# Patient Record
Sex: Female | Born: 1990 | Race: Black or African American | Hispanic: No | Marital: Single | Smoking: Never smoker
Health system: Southern US, Community
[De-identification: ages and names within clinical notes are randomized; demographics above are authoritative.]

## PROBLEM LIST (undated history)

## (undated) DIAGNOSIS — F419 Anxiety disorder, unspecified: Secondary | ICD-10-CM

## (undated) DIAGNOSIS — I10 Essential (primary) hypertension: Secondary | ICD-10-CM

## (undated) DIAGNOSIS — G43109 Migraine with aura, not intractable, without status migrainosus: Secondary | ICD-10-CM

## (undated) DIAGNOSIS — D219 Benign neoplasm of connective and other soft tissue, unspecified: Secondary | ICD-10-CM

## (undated) DIAGNOSIS — N946 Dysmenorrhea, unspecified: Secondary | ICD-10-CM

## (undated) DIAGNOSIS — D649 Anemia, unspecified: Secondary | ICD-10-CM

## (undated) HISTORY — DX: Anxiety disorder, unspecified: F41.9

## (undated) HISTORY — DX: Migraine with aura, not intractable, without status migrainosus: G43.109

## (undated) HISTORY — DX: Essential (primary) hypertension: I10

## (undated) HISTORY — DX: Dysmenorrhea, unspecified: N94.6

## (undated) HISTORY — DX: Benign neoplasm of connective and other soft tissue, unspecified: D21.9

---
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
## 2010-06-30 LAB — URINE MICROSCOPIC-ADD ON

## 2010-06-30 LAB — URINALYSIS, ROUTINE W REFLEX MICROSCOPIC
Bilirubin Urine: NEGATIVE
Glucose, UA: NEGATIVE mg/dL
Ketones, ur: 15 mg/dL — AB
Nitrite: NEGATIVE
Protein, ur: 30 mg/dL — AB
Specific Gravity, Urine: 1.024 (ref 1.005–1.030)
Urobilinogen, UA: 1 mg/dL (ref 0.0–1.0)
pH: 6.5 (ref 5.0–8.0)

## 2010-06-30 LAB — POCT PREGNANCY, URINE: Preg Test, Ur: NEGATIVE

## 2012-05-01 ENCOUNTER — Emergency Department (HOSPITAL_COMMUNITY)
Admission: EM | Admit: 2012-05-01 | Discharge: 2012-05-01 | Disposition: A | Discharge status: Home / Self Care | Payer: Self-pay | Attending: Emergency Medicine | Admitting: Emergency Medicine

## 2012-05-01 ENCOUNTER — Encounter (HOSPITAL_COMMUNITY): Payer: Self-pay | Admitting: *Deleted

## 2012-05-01 DIAGNOSIS — J029 Acute pharyngitis, unspecified: Secondary | ICD-10-CM | POA: Insufficient documentation

## 2012-05-01 DIAGNOSIS — R42 Dizziness and giddiness: Secondary | ICD-10-CM | POA: Insufficient documentation

## 2012-05-01 DIAGNOSIS — R51 Headache: Secondary | ICD-10-CM | POA: Insufficient documentation

## 2012-05-01 DIAGNOSIS — R110 Nausea: Secondary | ICD-10-CM | POA: Insufficient documentation

## 2012-05-01 DIAGNOSIS — IMO0001 Reserved for inherently not codable concepts without codable children: Secondary | ICD-10-CM | POA: Insufficient documentation

## 2012-05-01 DIAGNOSIS — J111 Influenza due to unidentified influenza virus with other respiratory manifestations: Secondary | ICD-10-CM | POA: Insufficient documentation

## 2012-05-01 DIAGNOSIS — R5381 Other malaise: Secondary | ICD-10-CM | POA: Insufficient documentation

## 2012-05-01 MED ORDER — OXYCODONE-ACETAMINOPHEN 5-325 MG PO TABS: ORAL_TABLET | ORAL | Status: DC

## 2012-05-01 MED ORDER — OXYCODONE-ACETAMINOPHEN 5-325 MG PO TABS
2.0000 | ORAL_TABLET | Freq: Once | ORAL | Status: AC
  Administered 2012-05-01: 2 via ORAL
  Filled 2012-05-01: qty 2

## 2012-05-01 NOTE — ED Provider Notes (Signed)
History     CSN: [id number]  Arrival date & time 05/01/12  1859   First MD Initiated Contact with Patient 05/01/12 1921      Chief Complaint  Patient presents with  . Influenza    (Consider location/radiation/quality/duration/timing/severity/associated sxs/prior treatment) HPI  Ashley Bullock is a 22 y.o. female complaining of fever/chills, pharyngitis, myalgia in bilateral shoulders, frontal sinus pressure and lightheaded sensation when going from sitting to standing. Patient denies emesis, rhinorrhea, cough, shortness of breath, abdominal pain, nausea vomiting, change in bowel or bladder habits, rash or meningismus.  History reviewed. No pertinent past medical history.  History reviewed. No pertinent past surgical history.  History reviewed. No pertinent family history.  History  Substance Use Topics  . Smoking status: Never Smoker   . Smokeless tobacco: Not on file  . Alcohol Use: Yes    OB History    Grav Para Term Preterm Abortions TAB SAB Ect Mult Living                  Review of Systems  Constitutional: Positive for fever and chills.  HENT: Positive for sore throat.   Respiratory: Negative for shortness of breath.   Cardiovascular: Negative for chest pain.  Gastrointestinal: Negative for nausea, vomiting, abdominal pain and diarrhea.  Musculoskeletal: Positive for myalgias.  Neurological: Positive for light-headedness and headaches.  All other systems reviewed and are negative.    Allergies  Amoxicillin  Home Medications  No current outpatient prescriptions on file.  BP 136/72  Pulse 109  Temp 100 F (37.8 C) (Oral)  Resp 20  SpO2 96%  Physical Exam  Nursing note and vitals reviewed. Constitutional: She is oriented to person, place, and time. She appears well-developed and well-nourished. No distress.  HENT:  Head: Normocephalic.  Right Ear: External ear normal.  Left Ear: External ear normal.  Mouth/Throat: Oropharynx is clear and moist.  No oropharyngeal exudate.       No tenderness to palpation of maxillary or frontal sinuses bilaterally,   No tonsillar hypertrophy  Eyes: Conjunctivae normal and EOM are normal. Pupils are equal, round, and reactive to light.  Neck: Normal range of motion. Neck supple.  Cardiovascular: Normal rate, regular rhythm, normal heart sounds and intact distal pulses.   Pulmonary/Chest: Effort normal and breath sounds normal. No stridor. No respiratory distress. She has no wheezes. She has no rales.  Abdominal: Soft. Bowel sounds are normal. She exhibits no distension. There is no tenderness. There is no rebound and no guarding.  Musculoskeletal: Normal range of motion.  Neurological: She is alert and oriented to person, place, and time.  Psychiatric: She has a normal mood and affect.    ED Course  Procedures (including critical care time)   Labs Reviewed  RAPID STREP SCREEN   No results found.   1. Influenza-like illness       MDM  Patient with flulike symptoms. Vital signs stable mildly afebrile mildly tachycardic. Tolerating by mouth. Stable for discharge to home. Discussed options for Tamiflu. Patient deferred tamiflu treatment at this time.   Pt verbalized understanding and agrees with care plan. Outpatient follow-up and return precautions given.    New Prescriptions   OXYCODONE-ACETAMINOPHEN (PERCOCET/ROXICET) 5-325 MG PER TABLET    1 to 2 tabs PO q6hrs  PRN for pain          Wynetta Emery, PA-C 05/01/12 1954

## 2012-05-01 NOTE — ED Notes (Signed)
C/o "flu like sx", lists: sore throat, feeling weak, feel hot & cold, HA, facial pain, dizziness (with position changes)  & nausea, (denies: congestion, vd, bleeding), onset of sx Saturday night. Taking mucinex. Also, took multi sx pill.

## 2012-05-02 NOTE — ED Provider Notes (Signed)
Medical screening examination/treatment/procedure(s) were performed by non-physician practitioner and as supervising physician I was immediately available for consultation/collaboration.   Cace Osorto L Malva Diesing, MD 05/02/12 1238 

## 2012-07-10 ENCOUNTER — Emergency Department (HOSPITAL_COMMUNITY)
Admission: EM | Admit: 2012-07-10 | Discharge: 2012-07-10 | Disposition: A | Discharge status: Home / Self Care | Payer: PRIVATE HEALTH INSURANCE | Attending: Emergency Medicine | Admitting: Emergency Medicine

## 2012-07-10 ENCOUNTER — Encounter (HOSPITAL_COMMUNITY): Payer: Self-pay | Admitting: Emergency Medicine

## 2012-07-10 DIAGNOSIS — L309 Dermatitis, unspecified: Secondary | ICD-10-CM

## 2012-07-10 DIAGNOSIS — L299 Pruritus, unspecified: Secondary | ICD-10-CM | POA: Insufficient documentation

## 2012-07-10 DIAGNOSIS — H579 Unspecified disorder of eye and adnexa: Secondary | ICD-10-CM | POA: Insufficient documentation

## 2012-07-10 DIAGNOSIS — L259 Unspecified contact dermatitis, unspecified cause: Secondary | ICD-10-CM | POA: Insufficient documentation

## 2012-07-10 MED ORDER — DESONIDE 0.05 % EX OINT: TOPICAL_OINTMENT | Freq: Two times a day (BID) | CUTANEOUS | Status: DC

## 2012-07-10 MED ORDER — PREDNISONE 10 MG PO TABS: 20.0000 mg | ORAL_TABLET | Freq: Every day | ORAL | Status: DC

## 2012-07-10 MED ORDER — PREDNISONE 20 MG PO TABS
60.0000 mg | ORAL_TABLET | Freq: Once | ORAL | Status: AC
  Administered 2012-07-10: 60 mg via ORAL
  Filled 2012-07-10: qty 3

## 2012-07-10 NOTE — ED Notes (Signed)
RESOURCE GUIDE  Chronic Pain Problems: Contact [facility]  [phone] Patients need to be referred by their primary care doctor.  Insufficient Money for Medicine: Contact [employer] Way:  call "211."   No Primary Care Doctor: - Call [facility]  [phone] - can help you locate a primary care doctor that  accepts your insurance, provides certain services, etc. - Physician Referral Service- 1-800-533-3463  Agencies that provide inexpensive medical care: - [facility]  [phone] - [facility]  [phone] - [facility]  [phone] - [facility]  [phone] - Planned Parenthood  [phone] - [facility]  [phone]  Medicaid-accepting [place] Providers: - [facility]- [address]  [phone], Mon-Fri 9am-7pm, Sat 9am-1pm - [facility]- [address], [address]  [phone] - [facility]- [address]  [phone] - [facility]- [address] [place] [address]  [phone] - Veita Bland- [address], [phone]  Only accepts [place] Access Medicaid patients after they have their name  applied to their card  Self Pay (no insurance) in [place]: - Sickle Cell Patients: Dr Eric Dean, [facility]  [address], [phone] - [facility]- [address]  [phone]       -     [facility] [place]- [address]       -     [facility]- see information above (Speak to Pam H if you do not have insurance)       -  [facility] [place]- [address],  [phone]       -  [facility]- [address] [place] [address], [phone]       -  Dr Osei-Bonsu-  [address], [place], [phone]       -  [facility] - [address], [phone]       -  [facility]- [address] [place] [address], [phone],  also [address], [phone]       -    [facility]- [address], [phone], 1st & 3rd Saturday        every month, 10am-1pm  [facility] [address] [place], [place] [address] [phone]  [facility] [address] [place], [place] [address] [phone]  1) Find a Doctor and Pay Out of Pocket Although you won't have to find out who is covered by your insurance plan, it is a good idea to ask around and get recommendations. You will then need to call the office and see if the doctor you have chosen will accept you as a new patient and what types of options they offer for patients who are self-pay. Some doctors offer discounts or will set up payment plans for their patients who do not have insurance, but you will need to ask so you aren't surprised when you get to your appointment.  2) Contact Your Local Health Department Not all health departments have doctors that can see patients for sick visits, but many do, so it is worth a call to see if yours does. If you don't know where your local health department is, you can check in your phone book. The CDC also has a tool   to help you locate your state's health department, and many state websites also have listings of all of their local health departments.  3) Find a Walk-in Clinic If your illness is not likely to be very severe or complicated, you may want to try a walk in clinic. These are popping up all over the country in pharmacies, drugstores, and shopping centers. They're usually staffed by nurse practitioners or physician assistants that have been trained to treat common illnesses and complaints. They're usually fairly quick and inexpensive. However, if you have serious medical issues or chronic medical problems, these are probably not your best option  STD Testing - [place] [facility] [place], [facility] Clinic, [address], [place],  phone [phone] or 1-877-539-9860.  Monday - Friday, call for an appointment. - [place] [facility] [place], [facility], [address], [place], phone [phone] or 1-877-539-9860.  Monday - Friday, call for an appointment.  Abuse/Neglect: - [place] Child Abuse Hotline (336) 641-3795 - [place] Child Abuse Hotline 800-378-5315 (After Hours)  Emergency Shelter:  [facility] (336) 271-5985  Maternity Homes: - [facility] (336) 275-9566 - [facility] (704) 372-4663  MRSA Hotline #:   832-7006  Dental Assistance If unable to pay or uninsured, contact:  [place] [facility] to become qualified for the adult dental clinic.  Patients with Medicaid: [facility] [address], [phone], [phone]  If unable to pay, or uninsured, contact [place] [facility] ([phone] in [place], [phone] in [place]) to become qualified for the adult dental clinic  [facility] [address] [place], [place] [address] (336) [phone] [email or web address]  Other Low-Cost Community Dental Services: - [facility]- [address], [place], [place], [address], [phone], Ext. [address], 2nd and 4th Thursday of the month at 6:30am.  10 clients each day by appointment, can sometimes see walk-in patients if someone does not show for an appointment. - [facility]- [address], [place], [place], [address], [phone] - [facility]- [address], [place], [place], [address], [phone] - [place] [facility]- [phone] - [facility]- [phone] - [facility]- [phone]       Behavioral Health Resources in the Community  Intensive Outpatient Programs: [place] [facility]      [address] [place], [place] [phone] Both a day and  evening program       [facility]     [address]        [place], [place] [address] [phone]         ADS: [facility] [address] [place] 336-882-2125  [place] [facility] ACCESS LINE: 1-800-853-5163 or 336-641-4981 [address] [place], [place] [address] [email or web address]/adult.htm   Substance Abuse Resources: - [facility]  336-882-2125 - [facility] 336-784-9470 - [facility] 336-285-9073 - [facility] 336-845-3988 - Residential & Outpatient Substance Abuse Program  800-659-3381  Psychological Services: -  Health  [phone] - [facility]  [phone] - [place] [facility], [address], [place], ACCESS LINE: 1-800-853-5163 or 336-641-4981, Http://www.[email or web address]/adult.htm  Mobile Crisis Teams:                                          [facility]         Mobile Crisis Care Unit 1-877-626-1772             [facility] [address] [place] 336-834-9664                                         Interventionist Sharon DeEsch [address] [place] [phone]  Self-Help/Support Groups: [facility] [place] Variety of support groups [phone] (call for more info)   Narcotics Anonymous (NA) [facility] [address] [place] - 2 meetings at this location  Residential Treatment Programs:  [facility]      [address]        [place]       [phone]         [facility] [address] [place], [place]  [address] [phone]  [facility]  [address] [place], [place] [address] 336-845-3988 Admissions: 8am-3pm M-F  [facility]     [address]        [place], [place]  [address]       336-841-1104         [facility] [address] [place], [place] [phone]  [facility] [address] [place], [place] 336-285-9073  [facility] - Intensive Outpatient      [address]     [place], [place]       [phone]         [facility]     [address] [place], [place] 877-615-2722 or 336-784-9470  [facility] ([facility]), Medicaid [address] [place], [place] 336-227-7417  [facility]                                               [address] [place] 800-659-3381  [place] [facility] Resources: [facility]- 1-888-581-9988               General Therapy                                                Julie Brannon, PhD        [address]                                       [place], [place] [address]         [phone]   Insurance  [facility]   [address] [place], [place] [address] 336-349-4454  [facility] [address] [place], [place] [address] 336-342-8316 Insurance/Medicaid/sponsorship through [facility]  [facility]                                              [address]                                          [place], [place] [address]    Therapy/tele-psych/case         336-342-8316          [facility] [address]   [place], [place]  [address]  Adolescent/group home/case management [phone]                                           Julia Brannon PhD       General therapy       Insurance   [phone]         Dr. Arfeen, Insurance, M-F 336- [phone]  [facility] [place]  [employer] [place] [facility] [address]                 [address]         [address]  [place]                                               [place]                              [place] Phone:  [phone]                                  Phone:   [phone]                   Phone:  [phone]  [facility], [phone] - [place] Services - [facility]- 1-888-581-9988       -     [facility] in [place], [address],             336-349-4454, Insurance  [place] Child Abuse Hotline (336) 342-1394 or (336) 342-3537 (After Hours)  

## 2012-07-10 NOTE — ED Notes (Signed)
PT. REPORTS ITCHY SORE EYES ONSET LAST Saturday WITH SLIGHT DRAINAGE , DENIES INJURY , ALSO REPORTS ITCHY RASHES AT ABDOMEN/CHEST.

## 2012-07-10 NOTE — ED Notes (Signed)
PT has rash under RT eye. Pt reports rash first started in FEB 2014 and went away . Rash returned in March. Pt denies using any new products.

## 2012-07-10 NOTE — ED Notes (Signed)
RESOURCE GUIDE  Chronic Pain Problems: Contact [facility]  [phone] Patients need to be referred by their primary care doctor.  Insufficient Money for Medicine: Contact United Way:  call "211."   No Primary Care Doctor: - Call Health Connect  [phone] - can help you locate a primary care doctor that  accepts your insurance, provides certain services, etc. - Physician Referral Service- 1-800-533-3463  Agencies that provide inexpensive medical care: - [facility]  [phone] - [facility]  [phone] - [facility]  [phone] - [facility]  [phone] - Planned Parenthood  [phone] - [facility]  [phone]  Medicaid-accepting [place] Providers: - [facility]- [address]  [phone], Mon-Fri 9am-7pm, Sat 9am-1pm - [facility]- [address], [address]  [phone] - [facility]- [address], [address]  [phone] - [facility]- [address] [place] [address]  [phone] - Veita Bland- [address], [phone]  Only accepts [place] Access Medicaid patients after they have their name  applied to their card  Self Pay (no insurance) in [place]: - Sickle Cell Patients: Dr Eric Dean, [facility]  [address], [phone] - [facility]- [address]  [phone]       -     [facility]- [address]       -     [facility]- see information above (Speak to Pam H if you do not have insurance)       -  [facility] [place]- [address],  [phone]       -  [facility]- [address] [place] [address], [phone]       -  Dr Osei-Bonsu-  [address], [place], [phone]       -  [facility] - [address], [phone]       -  [facility]- [address] [place] [address], [phone],  also [address], [phone]       -    [facility]- [address], [phone], 1st & 3rd Saturday        every month, 10am-1pm  [facility] [address] [place], [place] [address] [phone]  [facility] [address] [place],  [address] [phone]  1) Find a Doctor and Pay Out of Pocket Although you won't have to find out who is covered by your insurance plan, it is a good idea to ask around and get recommendations. You will then need to call the office and see if the doctor you have chosen will accept you as a new patient and what types of options they offer for patients who are self-pay. Some doctors offer discounts or will set up payment plans for their patients who do not have insurance, but you will need to ask so you aren't surprised when you get to your appointment.  2) Contact Your Local Health Department Not all health departments have doctors that can see patients for sick visits, but many do, so it is worth a call to see if yours does. If you don't know where your local health department is, you can check in your phone book. The CDC also has a tool   to help you locate your state's health department, and many state websites also have listings of all of their local health departments.  3) Find a Walk-in Clinic If your illness is not likely to be very severe or complicated, you may want to try a walk in clinic. These are popping up all over the country in pharmacies, drugstores, and shopping centers. They're usually staffed by nurse practitioners or physician assistants that have been trained to treat common illnesses and complaints. They're usually fairly quick and inexpensive. However, if you have serious medical issues or chronic medical problems, these are probably not your best option  STD Testing - [place] [facility] [place], [facility] Clinic, [address], [place],  phone [phone] or 1-877-539-9860.  Monday - Friday, call for an appointment. - [place] [facility] [place], [facility] Clinic, [address], [place], phone [phone] or 1-877-539-9860.  Monday - Friday, call for an appointment.  Abuse/Neglect: - [place] Child Abuse Hotline (336) 641-3795 - [place] Child Abuse Hotline 800-378-5315 (After Hours)  Emergency Shelter:  [facility] (336) 271-5985  Maternity Homes: - [facility] (336) 275-9566 - [facility] (704) 372-4663  MRSA Hotline #:   832-7006  Dental Assistance If unable to pay or uninsured, contact:  [place] [facility] to become qualified for the adult dental clinic.  Patients with Medicaid: [facility] [address], [phone], [phone]  If unable to pay, or uninsured, contact [place] [facility] ([phone] in [place], [phone] in [place]) to become qualified for the adult dental clinic  [facility] [address] [place], [place] [address] [phone]) [phone] www.drcivils.com  Other Low-Cost Community Dental Services: - [facility]- [address], [place], [place], [address], [phone], Ext. 123, 2nd and 4th Thursday of the month at 6:30am.  10 clients each day by appointment, can sometimes see walk-in patients if someone does not show for an appointment. - [facility]- [address], [place], [place], [address], [phone] - [facility]- [address], [place], [place], [address], [phone] - [place] [facility]- [phone] - [facility]- [phone] - [facility]- [phone]       Behavioral Health Resources in the Community  Intensive Outpatient Programs: [facility] [place] [facility]      [address] [place], [place] [phone] Both a day and  evening program       [facility]     [address]        [place], [place] [address] [phone]         ADS: [facility] [address] [place] [phone]  [place] [facility] ACCESS LINE: 1-800-853-5163 or 336-641-4981 [address] [place],  [address] Http://www.[facility].[email or web address]/adult.htm   Substance Abuse Resources: - [facility]  336-882-2125 - [facility] 336-784-9470 - [facility] 336-285-9073 - [facility] 336-845-3988 - Residential & Outpatient Substance Abuse Program  800-659-3381  Psychological Services: -  Health  [phone] - [facility]  [phone] - [place] [facility], [address], , ACCESS LINE: 1-800-853-5163 or 336-641-4981, Http://www.[facility].com/services/adult.htm  Mobile Crisis Teams:                                          [facility]         Mobile Crisis Care Unit 1-877-626-1772             [facility] [address] [place] [phone]                                         Interventionist Sharon DeEsch [address] [place] [phone]  Self-Help/Support Groups: [facility] [place] Variety of support groups [phone] (call for more info)   Narcotics Anonymous (NA) [facility] [address] [place] - 2 meetings at this location  Residential Treatment Programs:  [facility]      [address]        [place]       [phone]         [facility] [address] [place], [place]  [address] [phone]  [facility]  [address] [place], [place] [address] [phone] Admissions: 8am-3pm M-F  [facility]     [address]        [place], [place]  [address]       [phone]         [facility] [address] [place], [place] [phone]  [facility] [address] [place], [place] [phone]  [facility] - Intensive Outpatient      [address]     [place], [place]       [phone]         [facility]     [address] [place], [place] [phone] or 336-784-9470  [facility] ([facility]), Medicaid [address] [place], [place] 336-227-7417  [facility]                                               [address] [place] [phone]  [place] [facility] Resources: [facility]- 1-888-581-9988               General Therapy                                                Julie Brannon, PhD        [address]                                       [place], [place] [address]         [phone]   Insurance  [facility]   [address] [place],  [address] [phone]  [facility] [address] [place],  [address] [phone] Insurance/Medicaid/sponsorship through [facility]  [facility]                                              [address]                                          [place], [place] [address]    Therapy/tele-psych/case         [phone]          [facility] [address]   [place], [place]  [address]  Adolescent/group home/case management [phone]                                           Julia Brannon PhD       General therapy       Insurance   [phone]         Dr. Arfeen, Insurance, M-F 336- [phone]  [facility] [place]  [employer] [place] [facility] [address]                 [address]         [address]  [place]                                               [place]                              [place] Phone:  [phone]                                  Phone:   [phone]                   Phone:  [phone]  [place] [facility], [phone] - [place] Services - [facility]- 1-888-581-9988       -     [facility] in , [address],             [phone], Insurance  [place] Child Abuse Hotline (336) 342-1394 or (336) 342-3537 (After Hours)  

## 2012-07-10 NOTE — ED Provider Notes (Signed)
History     CSN: [id number]  Arrival date & time 07/10/12  1950   First MD Initiated Contact with Patient 07/10/12 2027      Chief Complaint  Patient presents with  . Eye Problem    (Consider location/radiation/quality/duration/timing/severity/associated sxs/prior treatment) HPI Comments: 22 year old female presents today with a rash, bilaterally.  Morbidly that has been recurrent.  She first noticed a rash, in February.  That resolved spontaneously on its this episode has been present for approximately one week.  It is itchy and burning with a dry appearance.  She does not endorse any new products soaps, cosmetics, foods, previous allergic reactions.  She has noticed, since this rash started again.  Last week, which is a little dryness to the corners of her mouth, extending to the angle of the jaw.  It is not nearly as bad as the periorbital rash.  She also endorses a slight rash on her left anterior chest wall and abdominal wall, which is described as a clumpy.  An itchy, but not red or weeping.  There is no history of eczema as a child  Patient is a 22 y.o. female presenting with eye problem. The history is provided by the patient.  Eye Problem Location:  Both Quality:  Burning Severity:  Moderate Timing:  Intermittent Associated symptoms: no nausea     History reviewed. No pertinent past medical history.  No past surgical history on file.  No family history on file.  History  Substance Use Topics  . Smoking status: Never Smoker   . Smokeless tobacco: Not on file  . Alcohol Use: Yes    OB History   Grav Para Term Preterm Abortions TAB SAB Ect Mult Living                  Review of Systems  Constitutional: Negative for fever.  Gastrointestinal: Negative for nausea.  Skin: Positive for rash.  All other systems reviewed and are negative.    Allergies  Amoxicillin  Home Medications   Current Outpatient Rx  Name  Route  Sig  Dispense  Refill  . desonide  (DESOWEN) 0.05 % ointment   Topical   Apply topically 2 (two) times daily.   15 g   0   . predniSONE (DELTASONE) 10 MG tablet   Oral   Take 2 tablets (20 mg total) by mouth daily.   15 tablet   0     BP 136/83  Pulse 95  Temp(Src) 98.9 F (37.2 C) (Oral)  Resp 16  SpO2 98%  LMP 06/26/2012  Physical Exam  Nursing note and vitals reviewed. Constitutional: She is oriented to person, place, and time. She appears well-developed and well-nourished.  HENT:  Head: Normocephalic.  Right Ear: External ear normal.  Left Ear: External ear normal.  Mouth/Throat: Oropharynx is clear and moist.  Eyes: Pupils are equal, round, and reactive to light. Right eye exhibits no discharge. Left eye exhibits no discharge.  Periorbital erythema.  Slight skin thickening bilaterally  Neck: Normal range of motion.  Cardiovascular: Normal rate.   Pulmonary/Chest: Effort normal.  Abdominal: Soft.  Musculoskeletal: Normal range of motion.  Neurological: She is alert and oriented to person, place, and time.  Skin: Skin is warm.    ED Course  Procedures (including critical care time)  Labs Reviewed - No data to display No results found.   1. Dermatitis       MDM   Dr. Eber Hong, examined.  The patient with  me and agrees.  Its he appears to be a dermatitis.  I will prescribe a mild hydrocortisone cream to be placed periorbital, but not on the lids along with prednisone, and referred the patient to dermatology        Arman Filter, NP 07/10/12 2055

## 2012-07-11 NOTE — ED Provider Notes (Signed)
Medical screening examination/treatment/procedure(s) were performed by non-physician practitioner and as supervising physician I was immediately available for consultation/collaboration.   Lyanne Co, MD 07/11/12 [phone]

## 2012-12-03 ENCOUNTER — Encounter (HOSPITAL_COMMUNITY): Payer: Self-pay | Admitting: Adult Health

## 2012-12-03 ENCOUNTER — Emergency Department (HOSPITAL_COMMUNITY)
Admission: EM | Admit: 2012-12-03 | Discharge: 2012-12-03 | Disposition: A | Discharge status: Home / Self Care | Payer: PRIVATE HEALTH INSURANCE | Attending: Emergency Medicine | Admitting: Emergency Medicine

## 2012-12-03 DIAGNOSIS — L0100 Impetigo, unspecified: Secondary | ICD-10-CM | POA: Insufficient documentation

## 2012-12-03 DIAGNOSIS — Z880 Allergy status to penicillin: Secondary | ICD-10-CM | POA: Insufficient documentation

## 2012-12-03 MED ORDER — CHLORHEXIDINE GLUCONATE 4 % EX LIQD: 60.0000 mL | Freq: Every day | CUTANEOUS | Status: DC | PRN

## 2012-12-03 MED ORDER — BACITRACIN ZINC 500 UNIT/GM EX OINT: TOPICAL_OINTMENT | Freq: Two times a day (BID) | CUTANEOUS | Status: DC

## 2012-12-03 MED ORDER — SULFAMETHOXAZOLE-TRIMETHOPRIM 800-160 MG PO TABS: 1.0000 | ORAL_TABLET | Freq: Two times a day (BID) | ORAL | Status: DC

## 2012-12-03 NOTE — ED Provider Notes (Signed)
CSN: [id number]     Arrival date & time 12/03/12  0012 History   First MD Initiated Contact with Patient 12/03/12 0035     Chief Complaint  Patient presents with  . Rash   (Consider location/radiation/quality/duration/timing/severity/associated sxs/prior Treatment) HPI This patient is a generally healthy young woman who presents with complaints of small purulent lesions which began around her left cheek. Lesions burst spontaneously and developed honey crusted appearance. Her skin has since defoliated. She is concerned and wants to know what has happened. She denies pain. Her tetanus is up-to-date. She has a history of similar symptoms on the contralateral side of her face which occurred sometime in the early spring. She has not had a fever.  History reviewed. No pertinent past medical history. History reviewed. No pertinent past surgical history. History reviewed. No pertinent family history. History  Substance Use Topics  . Smoking status: Never Smoker   . Smokeless tobacco: Not on file  . Alcohol Use: Yes   OB History   Grav Para Term Preterm Abortions TAB SAB Ect Mult Living                 Review of Systems Gen: no weight loss, fevers, chills, night sweats Eyes: no discharge or drainage, no occular pain or visual changes Nose: no epistaxis or rhinorrhea Mouth: no dental pain, no sore throat Neck: no neck pain Lungs: no SOB, cough, wheezing CV: no chest pain, palpitations, dependent edema or orthopnea Abd: no abdominal pain, nausea, vomiting GU: no dysuria or gross hematuria MSK: no myalgias or arthralgias Neuro: no headache, no focal neurologic deficits Skin: As per history of present illness, otherwise negative Psyche: negative.  Allergies  Amoxicillin  Home Medications  No current outpatient prescriptions on file. BP 119/70  Pulse 83  Temp(Src) 97.4 F (36.3 C) (Oral)  Resp 18  SpO2 97% Physical Exam Gen: well developed and well nourished appearing Head:  NCAT Eyes: PERL, EOMI Nose: no epistaixis or rhinorrhea Mouth/throat: mucosa is moist and pink, no lesions Neck: Normal to inspection Lungs: CTA B, no wheezing, rhonchi or rales Abd: Obese, normal to Back: Altered infection Skin: There's some superficially exfoliated skin on the left side of the face about the zygomatic arch region and temporal region. I see a single pustule located over the left cheek. No tenderness to palpation. No increased warmth. No signs of superinfection. Neuro: CN ii-xii grossly intact, no focal deficits Psyche; normal affect,  calm and cooperative.   ED Course  Procedures (including critical care time)  Diagnosis: Recurrent impetigo. We will treat with bacitracin. The patient is allergic to penicillin. Thus, Bactrim as alternative. We'll also prescribe Hibiclens and bacitracin to the naris. He should followup with her primary care physician. Counseled to return to the emergency department for any urgent health concerns   MDM  No diagnosis found. See above.     Brandt Loosen, MD 12/03/12 [phone]

## 2012-12-03 NOTE — ED Provider Notes (Signed)
Medical screening examination/treatment/procedure(s) were conducted as a shared visit with non-physician practitioner(s) and myself.  I personally evaluated the patient during the encounter  Brandt Loosen, MD 12/03/12 [phone]

## 2012-12-03 NOTE — ED Notes (Signed)
Reports one week of facial rash and burning sensation to face. Peeling around left cheek and eye began today. Denies use of new creams or chemicals.

## 2013-04-24 ENCOUNTER — Emergency Department (HOSPITAL_COMMUNITY)
Admission: EM | Admit: 2013-04-24 | Discharge: 2013-04-24 | Disposition: A | Discharge status: Home / Self Care | Payer: BC Managed Care – PPO | Attending: Emergency Medicine | Admitting: Emergency Medicine

## 2013-04-24 ENCOUNTER — Encounter (HOSPITAL_COMMUNITY): Payer: Self-pay | Admitting: Emergency Medicine

## 2013-04-24 DIAGNOSIS — Z881 Allergy status to other antibiotic agents status: Secondary | ICD-10-CM | POA: Insufficient documentation

## 2013-04-24 DIAGNOSIS — H9202 Otalgia, left ear: Secondary | ICD-10-CM

## 2013-04-24 DIAGNOSIS — T169XXA Foreign body in ear, unspecified ear, initial encounter: Secondary | ICD-10-CM

## 2013-04-24 DIAGNOSIS — Y929 Unspecified place or not applicable: Secondary | ICD-10-CM | POA: Insufficient documentation

## 2013-04-24 DIAGNOSIS — Y939 Activity, unspecified: Secondary | ICD-10-CM | POA: Insufficient documentation

## 2013-04-24 DIAGNOSIS — IMO0002 Reserved for concepts with insufficient information to code with codable children: Secondary | ICD-10-CM | POA: Insufficient documentation

## 2013-04-24 NOTE — Discharge Instructions (Signed)
Follow up with an ENT specialist for foreign body removal. Call office in morning to schedule follow up for within 24 hours. Return to the ED if symptoms worsen.  Ear Foreign Body An ear foreign body is an object that is stuck in the ear. It is common for young children to put objects into the ear canal. These may include pebbles, beads, beans, and any other small objects which will fit. In adults, objects such as cotton swabs may become lodged in the ear canal. In all ages, the most common foreign bodies are insects that enter the ear canal.  SYMPTOMS  Foreign bodies may cause pain, buzzing or roaring sounds, hearing loss, and ear drainage.  HOME CARE INSTRUCTIONS   Keep all follow-up appointments with your caregiver as told.  Keep small objects out of reach of young children. Tell them not to put anything in their ears. SEEK IMMEDIATE MEDICAL CARE IF:   You have bleeding from the ear.  You have increased pain or swelling of the ear.  You have reduced hearing.  You have discharge coming from the ear.  You have a fever.  You have a headache. MAKE SURE YOU:   Understand these instructions.  Will watch your condition.  Will get help right away if you are not doing well or get worse. Document Released: 03/20/2000 Document Revised: 06/15/2011 Document Reviewed: 11/09/2007 [facility] Patient Information 2014 [place].

## 2013-04-24 NOTE — ED Provider Notes (Signed)
CSN: [id number]     Arrival date & time 04/24/13  1457 History   This chart was scribed for non-physician practitioner Antonietta Breach, PA-C working with Babette Relic, MD by Adriana Reams, ED Scribe. This patient was seen in room TR04C/TR04C and the patient's care was started at 1611.  First MD Initiated Contact with Patient 04/24/13 1611     Chief Complaint  Patient presents with  . Otalgia    The history is provided by the patient. No language interpreter was used.   HPI Comments: Ashley Bullock is a 23 y.o. female who presents to the Emergency Department complaining of sudden onset, mild, constant left sided otalgia. She states the back of her earring popped off and landed in her left ear canal. She tried to "dig it out" but was unsuccessful. She denies fever, drainage from ear, swelling of outer ear, hearing changes or any other symptoms.   History reviewed. No pertinent past medical history. History reviewed. No pertinent past surgical history. No family history on file. History  Substance Use Topics  . Smoking status: Never Smoker   . Smokeless tobacco: Not on file  . Alcohol Use: Yes   OB History   Grav Para Term Preterm Abortions TAB SAB Ect Mult Living                 Review of Systems  Constitutional: Negative for fever.  HENT: Positive for ear pain. Negative for ear discharge.   All other systems reviewed and are negative.    Allergies  Amoxicillin  Home Medications  No current outpatient prescriptions on file.  BP 125/81  Pulse 77  Temp(Src) 98.4 F (36.9 C) (Oral)  Resp 18  Ht 5\' 5"  (1.651 m)  Wt 195 lb (88.451 kg)  BMI 32.45 kg/m2  SpO2 97%  LMP 04/18/2013   Physical Exam  Nursing note and vitals reviewed. Constitutional: She is oriented to person, place, and time. She appears well-developed and well-nourished. No distress.  HENT:  Head: Normocephalic and atraumatic.  Right Ear: Hearing and external ear normal.  Left Ear: Hearing and external ear  normal. No drainage, swelling or tenderness. A foreign body is present.  Earring back in left ear canal. Otherwise, ear canal appears normal.  Eyes: Conjunctivae and EOM are normal. No scleral icterus.  Neck: Normal range of motion. Neck supple. No tracheal deviation present.  Cardiovascular: Normal rate.   Pulmonary/Chest: Effort normal. No respiratory distress.  Musculoskeletal: Normal range of motion.  Neurological: She is alert and oriented to person, place, and time.  Skin: Skin is warm and dry. No rash noted. She is not diaphoretic. No erythema. No pallor.  Psychiatric: She has a normal mood and affect. Her behavior is normal.    ED Course  Procedures (including critical care time) DIAGNOSTIC STUDIES: Oxygen Saturation is 97% on RA, adequate by my interpretation.    COORDINATION OF CARE: 4:48 PM Discussed treatment plan which includes foreign body removal with pt at bedside and pt agreed to plan.    Labs Review Labs Reviewed - No data to display Imaging Review No results found.  EKG Interpretation   None       MDM   1. Acute foreign body of ear, initial encounter   2. Otalgia of left ear     Uncomplicated foreign body in left ear canal. Patient was nontoxic-appearing, hemodynamically stable, and afebrile. No drainage from the ear. No tenderness of the effected ear. Hearing intact bilaterally. Foreign body attempted to  be removed with tweezers, ear wick, and warm water flush; attempts today unsuccessful. Patient stable for discharge with instruction to followup with ENT for foreign body removal. Return precautions provided and patient agreeable to plan with no unaddressed concerns.   Filed Vitals:   04/24/13 1506  BP: 125/81  Pulse: 77  Temp: 98.4 F (36.9 C)  TempSrc: Oral  Resp: 18  Height: 5\' 5"  (1.651 m)  Weight: 195 lb (88.451 kg)  SpO2: 97%    I personally performed the services described in this documentation, which was scribed in my presence. The  recorded information has been reviewed and is accurate.    Antonietta Breach, PA-C 04/24/13 1758

## 2013-04-24 NOTE — ED Notes (Signed)
Attempted to irrigate patient's ear for back to earring. Wax did come out from ear but back to earring did not. Different RN to try

## 2013-04-24 NOTE — ED Notes (Signed)
Pt states the earring back is stuck on her L ear.  She tried to "dig it out" but was unsuccessful.

## 2013-04-27 NOTE — ED Provider Notes (Signed)
Medical screening examination/treatment/procedure(s) were performed by non-physician practitioner and as supervising physician I was immediately available for consultation/collaboration.  Babette Relic, MD 04/27/13 [phone]

## 2014-08-23 ENCOUNTER — Emergency Department (HOSPITAL_COMMUNITY)
Admission: EM | Admit: 2014-08-23 | Discharge: 2014-08-23 | Disposition: A | Discharge status: Home / Self Care | Payer: BC Managed Care – PPO | Attending: Emergency Medicine | Admitting: Emergency Medicine

## 2014-08-23 ENCOUNTER — Encounter (HOSPITAL_COMMUNITY): Payer: Self-pay | Admitting: Emergency Medicine

## 2014-08-23 DIAGNOSIS — Z862 Personal history of diseases of the blood and blood-forming organs and certain disorders involving the immune mechanism: Secondary | ICD-10-CM | POA: Insufficient documentation

## 2014-08-23 DIAGNOSIS — G43909 Migraine, unspecified, not intractable, without status migrainosus: Secondary | ICD-10-CM | POA: Diagnosis present

## 2014-08-23 DIAGNOSIS — Z880 Allergy status to penicillin: Secondary | ICD-10-CM | POA: Diagnosis not present

## 2014-08-23 DIAGNOSIS — Z8669 Personal history of other diseases of the nervous system and sense organs: Secondary | ICD-10-CM

## 2014-08-23 HISTORY — DX: Anemia, unspecified: D64.9

## 2014-08-23 NOTE — ED Notes (Signed)
NP at the bedside

## 2014-08-23 NOTE — ED Notes (Signed)
Pt st's she had a migraine earlier today but now it's gone.  Pt st's she just wants to be checked because the migraine lasted longer than usual.  No complaints at this time.

## 2014-08-23 NOTE — ED Provider Notes (Signed)
CSN: [id number]     Arrival date & time 08/23/14  1542 History  This chart was scribed for [facility], NP, working with Charlesetta Shanks, MD by Starleen Arms, ED Scribe. This patient was seen in room TR09C/TR09C and the patient's care was started at 4:29 PM.   Chief Complaint  Patient presents with  . Migraine   The history is provided by the patient. No language interpreter was used.   HPI Comments: Ashley Bullock is a 24 y.o. female with a history of migraines who presents to the Emergency Department complaining of a currently resolved headache without prodrome with associated left sided facial pain that lasted from 11:00 am - 1:45 pm today.  She also notes sensation of temperature fluctuations, blurry vision, clamping of her left eye lid, and a sensation of tightness over her left eye during the episode.  Her eye complaints lingered for slightly longer than other components of her complaint.  Patient took benadryl without relief but no other medications for this complaint.  She reports sometimes taking Midol for severe episodes but did not take any for this episode.  She reports that the episode was indicative of her typical migraine differing only in increased duration.  Patient was seen by her PCP < 1 week ago where she was diagnosis with anemia.  She denies a history of other chronic medical conditions and does not take any medications regularly.  She denies n/v, CP, dizziness, dysuria, frequency, ear pain, sore throat, neck pain.     Past Medical History  Diagnosis Date  . Migraine   . Anemia    No past surgical history on file. No family history on file. History  Substance Use Topics  . Smoking status: Never Smoker   . Smokeless tobacco: Not on file  . Alcohol Use: Yes   OB History    No data available     Review of Systems  Eyes: Positive for visual disturbance.  Neurological: Positive for headaches.  All other systems reviewed and are negative.     Allergies   Amoxicillin  Home Medications   Prior to Admission medications   Not on File   BP 134/73 mmHg  Pulse 87  Temp(Src) 98.2 F (36.8 C) (Oral)  Resp 14  Ht 5\' 5"  (1.651 m)  Wt 239 lb (108.41 kg)  BMI 39.77 kg/m2  SpO2 97%  LMP 08/15/2014 Physical Exam  Constitutional: She is oriented to person, place, and time. She appears well-developed and well-nourished. No distress.  HENT:  Head: Normocephalic and atraumatic.  Right Ear: Tympanic membrane normal.  Left Ear: Tympanic membrane normal.  Nose: Nose normal.  Mouth/Throat: Uvula is midline, oropharynx is clear and moist and mucous membranes are normal.  Eyes: Conjunctivae and EOM are normal.  Neck: Normal range of motion. Neck supple. No tracheal deviation present.  Cardiovascular: Normal rate and regular rhythm.   Pulmonary/Chest: Effort normal. No respiratory distress. She has no wheezes. She has no rales.  Abdominal: Soft. Bowel sounds are normal. She exhibits no mass. There is no tenderness.  Musculoskeletal: Normal range of motion. She exhibits no edema.  Radial and pedal pulses strong, adequate circulation, good touch sensation.  Neurological: She is alert and oriented to person, place, and time. She has normal strength. No cranial nerve deficit or sensory deficit. She displays a negative Romberg sign. Gait normal.  Reflex Scores:      Bicep reflexes are 2+ on the right side and 2+ on the left side.  Brachioradialis reflexes are 2+ on the right side and 2+ on the left side.      Patellar reflexes are 2+ on the right side and 2+ on the left side.      Achilles reflexes are 2+ on the right side and 2+ on the left side. Rapid alternating movement without difficulty. Stands on one foot without difficulty.  Skin: Skin is warm and dry.  Psychiatric: She has a normal mood and affect. Her behavior is normal.  Nursing note and vitals reviewed.   ED Course  Procedures (including critical care time) DIAGNOSTIC  STUDIES: Oxygen Saturation is 97% on RA, normal by my interpretation.    COORDINATION OF CARE:  4:38 PM Discussed treatment plan with patient at bedside.  Patient acknowledges and agrees with plan.     MDM  24 y.o. female with headache earlier today that has resolved. Similar to usual migraines. Stable for d/c without focal neuro deficits. She will return as needed if symptoms return.   Final diagnoses:  H/O migraine   I personally performed the services described in this documentation, which was scribed in my presence. The recorded information has been reviewed and is accurate.    [place], NP 08/23/14 2223  Charlesetta Shanks, MD 08/29/14 [phone]

## 2014-08-30 ENCOUNTER — Other Ambulatory Visit (HOSPITAL_COMMUNITY)
Admission: RE | Admit: 2014-08-30 | Discharge: 2014-08-30 | Disposition: A | Discharge status: Home / Self Care | Payer: BC Managed Care – PPO | Source: Ambulatory Visit | Attending: Physician Assistant | Admitting: Physician Assistant

## 2014-08-30 ENCOUNTER — Other Ambulatory Visit: Payer: Self-pay | Admitting: Physician Assistant

## 2014-08-30 DIAGNOSIS — N760 Acute vaginitis: Secondary | ICD-10-CM | POA: Diagnosis present

## 2014-08-30 DIAGNOSIS — Z01419 Encounter for gynecological examination (general) (routine) without abnormal findings: Secondary | ICD-10-CM | POA: Diagnosis present

## 2014-08-30 DIAGNOSIS — Z113 Encounter for screening for infections with a predominantly sexual mode of transmission: Secondary | ICD-10-CM | POA: Diagnosis present

## 2014-09-05 LAB — CYTOLOGY - PAP

## 2015-04-11 ENCOUNTER — Encounter (HOSPITAL_COMMUNITY): Payer: Self-pay

## 2015-04-11 ENCOUNTER — Emergency Department (HOSPITAL_COMMUNITY)
Admission: EM | Admit: 2015-04-11 | Discharge: 2015-04-11 | Disposition: A | Discharge status: Home / Self Care | Payer: BC Managed Care – PPO | Attending: Emergency Medicine | Admitting: Emergency Medicine

## 2015-04-11 DIAGNOSIS — B349 Viral infection, unspecified: Secondary | ICD-10-CM | POA: Diagnosis not present

## 2015-04-11 DIAGNOSIS — M545 Low back pain: Secondary | ICD-10-CM | POA: Diagnosis not present

## 2015-04-11 DIAGNOSIS — Z8679 Personal history of other diseases of the circulatory system: Secondary | ICD-10-CM | POA: Insufficient documentation

## 2015-04-11 DIAGNOSIS — Z862 Personal history of diseases of the blood and blood-forming organs and certain disorders involving the immune mechanism: Secondary | ICD-10-CM | POA: Diagnosis not present

## 2015-04-11 DIAGNOSIS — J029 Acute pharyngitis, unspecified: Secondary | ICD-10-CM | POA: Diagnosis present

## 2015-04-11 DIAGNOSIS — Z880 Allergy status to penicillin: Secondary | ICD-10-CM | POA: Insufficient documentation

## 2015-04-11 LAB — RAPID STREP SCREEN (MED CTR MEBANE ONLY): Streptococcus, Group A Screen (Direct): NEGATIVE

## 2015-04-11 MED ORDER — GUAIFENESIN 100 MG/5ML PO LIQD: 100.0000 mg | ORAL | Status: DC | PRN

## 2015-04-11 NOTE — ED Notes (Signed)
Pt presents with sudden onset of sore throat, nasal congestion and cough x 2 days.  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

## 2015-04-11 NOTE — ED Notes (Signed)
Declined W/C at D/C and was escorted to lobby by RN. 

## 2015-04-11 NOTE — Discharge Instructions (Signed)

## 2015-04-11 NOTE — ED Provider Notes (Signed)
CSN: [id number]     Arrival date & time 04/11/15  1020 History  By signing my name below, I, Essence Howell, attest that this documentation has been prepared under the direction and in the presence of Montine Circle, PA-C Electronically Signed: Ladene Artist, ED Scribe 04/11/2015 at 11:47 AM.   No chief complaint on file.  The history is provided by the patient. No language interpreter was used.   HPI Comments: Ashley Bullock is a 25 y.o. female who presents to the Emergency Department with a chief complaint of persistent sore throat onset yesterday. Pt reports that pain is exacerbated with swallowing. She denies associated cough, intermittent fever Tmax 101 F, chills, mild back pain since yesterday. ED temperature 98.3 F. She has tried NyQuil and Robitussin without significant relief. Allergy to Amoxicillin.   Past Medical History  Diagnosis Date  . Migraine   . Anemia    No past surgical history on file. No family history on file. Social History  Substance Use Topics  . Smoking status: Never Smoker   . Smokeless tobacco: Not on file  . Alcohol Use: Yes   OB History    No data available     Review of Systems  Constitutional: Positive for fever and chills.  HENT: Positive for sore throat.   Respiratory: Positive for cough.   Musculoskeletal: Positive for back pain (mild).   Allergies  Amoxicillin  Home Medications   Prior to Admission medications   Not on File   BP 142/88 mmHg  Pulse 116  Temp(Src) 98.3 F (36.8 C) (Oral)  Resp 16  Ht 5\' 5"  (1.651 m)  Wt 230 lb (104.327 kg)  BMI 38.27 kg/m2  SpO2 100% Physical Exam Physical Exam  Constitutional: Pt  is oriented to person, place, and time. Appears well-developed and well-nourished. No distress.  HENT:  Head: Normocephalic and atraumatic.  Right Ear: Tympanic membrane, external ear and ear canal normal.  Left Ear: Tympanic membrane, external ear and ear canal normal.  Nose: Mucosal edema and mild rhinorrhea  present. No epistaxis. Right sinus exhibits no maxillary sinus tenderness and no frontal sinus tenderness. Left sinus exhibits no maxillary sinus tenderness and no frontal sinus tenderness.  Mouth/Throat: Uvula is midline and mucous membranes are normal. Mucous membranes are not pale and not cyanotic. No oropharyngeal exudate, posterior oropharyngeal edema, posterior oropharyngeal erythema or tonsillar abscesses.  Eyes: Conjunctivae are normal. Pupils are equal, round, and reactive to light.  Neck: Normal range of motion and full passive range of motion without pain.  Cardiovascular: Normal rate and intact distal pulses.   Pulmonary/Chest: Effort normal and breath sounds normal. No stridor.  Clear and equal breath sounds without focal wheezes, rhonchi, rales  Abdominal: Soft. Bowel sounds are normal. There is no tenderness.  Musculoskeletal: Normal range of motion.  Lymphadenopathy:    Pthas no cervical adenopathy.  Neurological: Pt is alert and oriented to person, place, and time.  Skin: Skin is warm and dry. No rash noted. Pt is not diaphoretic.  Psychiatric: Normal mood and affect.  Nursing note and vitals reviewed.  ED Course  Procedures (including critical care time) DIAGNOSTIC STUDIES: Oxygen Saturation is 100% on RA, normal by my interpretation.    COORDINATION OF CARE: 11:36 AM-Discussed treatment plan which includes strep screen with pt at bedside and pt agreed to plan.    MDM   Final diagnoses:  Viral syndrome    Patients symptoms are consistent with URI, likely viral etiology. Discussed that antibiotics are not indicated  for viral infections. Pt will be discharged with symptomatic treatment.  Verbalizes understanding and is agreeable with plan. Pt is hemodynamically stable & in NAD prior to dc.   I personally performed the services described in this documentation, which was scribed in my presence. The recorded information has been reviewed and is accurate.       Montine Circle, PA-C 04/11/15 [place], MD 04/12/15 0630

## 2015-04-13 ENCOUNTER — Encounter (HOSPITAL_COMMUNITY): Payer: Self-pay

## 2015-04-13 ENCOUNTER — Emergency Department (HOSPITAL_COMMUNITY)
Admission: EM | Admit: 2015-04-13 | Discharge: 2015-04-14 | Disposition: A | Discharge status: Home / Self Care | Payer: BC Managed Care – PPO | Attending: Emergency Medicine | Admitting: Emergency Medicine

## 2015-04-13 DIAGNOSIS — Z8679 Personal history of other diseases of the circulatory system: Secondary | ICD-10-CM | POA: Insufficient documentation

## 2015-04-13 DIAGNOSIS — J3489 Other specified disorders of nose and nasal sinuses: Secondary | ICD-10-CM | POA: Insufficient documentation

## 2015-04-13 DIAGNOSIS — Z880 Allergy status to penicillin: Secondary | ICD-10-CM | POA: Diagnosis not present

## 2015-04-13 DIAGNOSIS — J029 Acute pharyngitis, unspecified: Secondary | ICD-10-CM | POA: Insufficient documentation

## 2015-04-13 DIAGNOSIS — H6693 Otitis media, unspecified, bilateral: Secondary | ICD-10-CM | POA: Diagnosis not present

## 2015-04-13 DIAGNOSIS — H9202 Otalgia, left ear: Secondary | ICD-10-CM | POA: Diagnosis present

## 2015-04-13 DIAGNOSIS — Z862 Personal history of diseases of the blood and blood-forming organs and certain disorders involving the immune mechanism: Secondary | ICD-10-CM | POA: Diagnosis not present

## 2015-04-13 DIAGNOSIS — R1110 Vomiting, unspecified: Secondary | ICD-10-CM | POA: Diagnosis not present

## 2015-04-13 MED ORDER — AZITHROMYCIN 250 MG PO TABS: 250.0000 mg | ORAL_TABLET | Freq: Every day | ORAL | Status: DC

## 2015-04-13 MED ORDER — ONDANSETRON 4 MG PO TBDP
4.0000 mg | ORAL_TABLET | Freq: Once | ORAL | Status: AC
  Administered 2015-04-13: 4 mg via ORAL
  Filled 2015-04-13: qty 1

## 2015-04-13 MED ORDER — ONDANSETRON HCL 4 MG PO TABS: 4.0000 mg | ORAL_TABLET | Freq: Three times a day (TID) | ORAL | Status: DC | PRN

## 2015-04-13 NOTE — Discharge Instructions (Signed)

## 2015-04-13 NOTE — ED Notes (Signed)
Pt here with c/o left ear pain, sore throat and congestion. She was just seen here a few days ago for same complaints (except the ear pain is new which started today at 1300). She states she tested negative for strep throat and was told she has a virus. She took Robitussin and hycodan without relief. She states the ear ache started after taking the hycodan.

## 2015-04-13 NOTE — ED Notes (Signed)
Pt began to vomit during triage.

## 2015-04-13 NOTE — ED Provider Notes (Signed)
CSN: [id number]     Arrival date & time 04/13/15  2254 History   First MD Initiated Contact with Patient 04/13/15 2306     Chief Complaint  Patient presents with  . Otalgia     (Consider location/radiation/quality/duration/timing/severity/associated sxs/prior Treatment) HPI   25 year old female presents with cold symptoms. For the past 2 days patient developed persistent sore throat, left ear pain, and congestion symptoms worsen with swallowing. Report intermittent fever, and body aches. She has been using NyQuil and Robitussin initially without adequate relief. She was seen in ED yesterday for this complaint. Her strep test was negative for strep infection. She was diagnosed with a viral infection and was treated symptomatically. Symptoms patient has noticed increasing in pain which she described as a throbbing sensation and persistent without any changes in hearing or any drainage. She was prescribed Hycodan which she took earlier today but felt that her body cannot tolerate the medication. Since then she has vomited once while waiting the ED. Currently denies having any active nausea. No complaints of headache, throat swelling, trouble breathing, wheezing, abdominal cramping, or diarrhea. 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  Past Medical History  Diagnosis Date  . Migraine   . Anemia    History reviewed. No pertinent past surgical history. No family history on file. Social History  Substance Use Topics  . Smoking status: Never Smoker   . Smokeless tobacco: None  . Alcohol Use: Yes   OB History    No data available     Review of Systems  All other systems reviewed and are negative.     Allergies  Amoxicillin  Home Medications   Prior to Admission medications   Medication Sig Start Date End Date Taking? Authorizing Provider  guaiFENesin (ROBITUSSIN) 100 MG/5ML liquid Take 5-10 mLs (100-200 mg total) by mouth every 4 (four) hours as needed for cough. 04/11/15  Yes  Montine Circle, PA-C  HYDROcodone-homatropine (HYCODAN) 5-1.5 MG/5ML syrup Take 5 mLs by mouth every 6 (six) hours as needed for cough.   Yes Historical Provider, MD   BP 134/72 mmHg  Pulse 80  Temp(Src) 97.5 F (36.4 C) (Oral)  Resp 16  SpO2 100%  LMP 03/19/2015 (Approximate) Physical Exam  Constitutional: She is oriented to person, place, and time. She appears well-developed and well-nourished. No distress.  Well-appearing, nontoxic in appearance  HENT:  Head: Atraumatic.  Ears: Bilateral TMs are erythematous and bulging without obvious effusion. Normal ear canal Nose: Rhinorrhea Throat: Uvula is midline, no tonsillar enlargement or exudates. No trismus.   Eyes: Conjunctivae are normal.  Neck: Neck supple.  Cardiovascular: Normal rate and regular rhythm.   Pulmonary/Chest: Effort normal and breath sounds normal. She has no wheezes.  Abdominal: Soft. There is no tenderness.  Musculoskeletal: She exhibits no edema.  Lymphadenopathy:    She has no cervical adenopathy.  Neurological: She is alert and oriented to person, place, and time.  Skin: No rash noted.  Psychiatric: She has a normal mood and affect.  Nursing note and vitals reviewed.   ED Course  Procedures (including critical care time) Labs Review Labs Reviewed - No data to display  Imaging Review No results found. I have personally reviewed and evaluated these images and lab results as part of my medical decision-making.   EKG Interpretation None      MDM   Final diagnoses:  Bilateral acute otitis media, recurrence not specified, unspecified otitis media type    BP 134/72 mmHg  Pulse 80  Temp(Src) 97.5 F (36.4 C) (Oral)  Resp 16  SpO2 100%  LMP 03/19/2015 (Approximate)   11:37 PM patient here with viral syndrome for the past several days. She has been treated symptomatically without adequate improvement. On today's examination patient does have bilateral TM erythema along with ear pain concerning  for possible overlying otitis media. She also vomited once after taking Hycodan which is likely a side effect and not a true allergic reaction. Zofran given in the ED, patient felt better.  Patient now able to tolerates by mouth. Given potential of superimpose bacterial infection causing otitis media, I plan to prescribe patient antibiotic as treatment. Recommend watchful waiting for at least 2 days before taking antibiotic if symptoms does not improve. Patient voiced understanding and agrees with plan. Return precaution discussed.   Domenic Moras, PA-C 04/13/15 2356  Varney Biles, MD 04/14/15 [id number]

## 2015-04-13 NOTE — ED Notes (Signed)
Pt states that "I think I am having side effects from the hydrocodone syrup that I am taking" Pt states "I am having ear congestion, raspy voice, headache, and I lost my balance today"

## 2015-04-14 LAB — CULTURE, GROUP A STREP

## 2015-04-14 NOTE — ED Notes (Signed)
Pt verbalized understanding of prescription use and has no further questions. Pt stable and NAD.

## 2017-05-21 ENCOUNTER — Other Ambulatory Visit: Payer: Self-pay | Admitting: Physician Assistant

## 2017-05-21 ENCOUNTER — Other Ambulatory Visit (HOSPITAL_COMMUNITY)
Admission: RE | Admit: 2017-05-21 | Discharge: 2017-05-21 | Disposition: A | Discharge status: Home / Self Care | Payer: BC Managed Care – PPO | Source: Ambulatory Visit | Attending: Family Medicine | Admitting: Family Medicine

## 2017-05-21 DIAGNOSIS — Z01419 Encounter for gynecological examination (general) (routine) without abnormal findings: Secondary | ICD-10-CM | POA: Diagnosis not present

## 2017-05-24 LAB — CYTOLOGY - PAP
ADEQUACY: ABSENT
Diagnosis: NEGATIVE

## 2017-12-18 ENCOUNTER — Encounter (HOSPITAL_COMMUNITY): Payer: Self-pay | Admitting: *Deleted

## 2017-12-18 ENCOUNTER — Emergency Department (HOSPITAL_COMMUNITY)
Admission: EM | Admit: 2017-12-18 | Discharge: 2017-12-18 | Disposition: A | Discharge status: Home / Self Care | Payer: BC Managed Care – PPO | Attending: Emergency Medicine | Admitting: Emergency Medicine

## 2017-12-18 ENCOUNTER — Emergency Department (HOSPITAL_COMMUNITY): Payer: BC Managed Care – PPO

## 2017-12-18 DIAGNOSIS — R05 Cough: Secondary | ICD-10-CM | POA: Insufficient documentation

## 2017-12-18 DIAGNOSIS — R0981 Nasal congestion: Secondary | ICD-10-CM | POA: Insufficient documentation

## 2017-12-18 DIAGNOSIS — R51 Headache: Secondary | ICD-10-CM | POA: Diagnosis not present

## 2017-12-18 DIAGNOSIS — G501 Atypical facial pain: Secondary | ICD-10-CM | POA: Diagnosis present

## 2017-12-18 DIAGNOSIS — J0110 Acute frontal sinusitis, unspecified: Secondary | ICD-10-CM | POA: Insufficient documentation

## 2017-12-18 MED ORDER — GUAIFENESIN 100 MG/5ML PO LIQD: 100.0000 mg | ORAL | 0 refills | Status: DC | PRN

## 2017-12-18 MED ORDER — AZITHROMYCIN 250 MG PO TABS: 250.0000 mg | ORAL_TABLET | Freq: Every day | ORAL | 0 refills | Status: DC

## 2017-12-18 NOTE — ED Provider Notes (Signed)
[place] EMERGENCY DEPARTMENT Provider Note   CSN: [id number] Arrival date & time: 12/18/17  1321     History   Chief Complaint Chief Complaint  Patient presents with  . URI    HPI Ashley Bullock is a 27 y.o. female with a past medical history significant for migraines and anemia who presents for evaluation of facial pain, nasal congestion and cough x1 week.  Patient states she was seen by a provider 1 week ago and given penicillin for upper respiratory infection however she continues to have cough and nasal congestion.  States she has had intermittent headaches over the last 3 days which are located to her front temporal bones.  Admits to frontal sinus pressure. Patient states these are relieved with hot showers and Zyrtec over-the-counter.  Does have history of chronic migraines however she states this feels more sinus related than her usual migraines.  Admits a productive cough with yellow phlegm.  No hemoptysis.  Denies fever, chills, chest pain, shortness of breath, nausea, hemoptysis, vomiting, abdominal pain, diarrhea, constipation.  No known sick contacts.  Admits postnasal drip, her cough worsens when she lays down.   HPI  Past Medical History:  Diagnosis Date  . Anemia   . Migraine     There are no active problems to display for this patient.   History reviewed. No pertinent surgical history.   OB History   None      Home Medications    Prior to Admission medications   Medication Sig Start Date End Date Taking? Authorizing Provider  azithromycin (ZITHROMAX) 250 MG tablet Take 1 tablet (250 mg total) by mouth daily. Take first 2 tablets together, then 1 every day until finished. 12/18/17   Piper Hassebrock A, PA-C  guaiFENesin (ROBITUSSIN) 100 MG/5ML liquid Take 5-10 mLs (100-200 mg total) by mouth every 4 (four) hours as needed for cough. 12/18/17   Damiean Lukes A, PA-C  HYDROcodone-homatropine (HYCODAN) 5-1.5 MG/5ML syrup Take 5 mLs by  mouth every 6 (six) hours as needed for cough.    [provider]  ondansetron (ZOFRAN) 4 MG tablet Take 1 tablet (4 mg total) by mouth every 8 (eight) hours as needed for nausea or vomiting. 04/13/15   Domenic Moras, PA-C    Family History History reviewed. No pertinent family history.  Social History Social History   Tobacco Use  . Smoking status: Never Smoker  Substance Use Topics  . Alcohol use: Yes  . Drug use: No     Allergies   Amoxicillin   Review of Systems Review of Systems Review of systems negative unless otherwise stated in the HPI  Physical Exam Updated Vital Signs BP 121/73 (BP Location: Right Arm)   Pulse 98   Temp 98.3 F (36.8 C) (Oral)   Resp 18   SpO2 99%   Physical Exam  Constitutional: She appears well-developed and well-nourished. No distress.  HENT:  Head: Normocephalic and atraumatic.  Right Ear: Tympanic membrane, external ear and ear canal normal.  Left Ear: Tympanic membrane, external ear and ear canal normal.  Nose: Rhinorrhea present. No sinus tenderness, nasal deformity or septal deviation. No epistaxis. Right sinus exhibits frontal sinus tenderness. Right sinus exhibits no maxillary sinus tenderness. Left sinus exhibits frontal sinus tenderness. Left sinus exhibits no maxillary sinus tenderness.  Mouth/Throat: Uvula is midline, oropharynx is clear and moist and mucous membranes are normal. No oropharyngeal exudate. No tonsillar exudate.  Mild tenderness palpation over frontal sinuses.  No tenderness to  palpation over the maxillary sinuses  Eyes: Pupils are equal, round, and reactive to light.  Neck: Normal range of motion. Neck supple.  Cardiovascular: Normal rate, regular rhythm, normal heart sounds and intact distal pulses. Exam reveals no gallop and no friction rub.  No murmur heard. Pulmonary/Chest: Effort normal and breath sounds normal. No stridor. No respiratory distress. She has no wheezes. She has no rales. She exhibits no  tenderness.  Abdominal: Soft. Bowel sounds are normal. She exhibits no distension. There is no tenderness.  Musculoskeletal: Normal range of motion.  Neurological: She is alert.  Skin: Skin is warm and dry. She is not diaphoretic.  Psychiatric: She has a normal mood and affect.  Nursing note and vitals reviewed.    ED Treatments / Results  Labs (all labs ordered are listed, but only abnormal results are displayed) Labs Reviewed - No data to display  EKG None  Radiology Dg Chest 2 View  Result Date: 12/18/2017 CLINICAL DATA:  Cough and congestion for 2 days EXAM: CHEST - 2 VIEW COMPARISON:  None. FINDINGS: The heart size and mediastinal contours are within normal limits. Both lungs are clear. The visualized skeletal structures are unremarkable. IMPRESSION: No active cardiopulmonary disease. Electronically Signed   By: Inez Catalina M.D.   On: 12/18/2017 15:10    Procedures Procedures (including critical care time)  Medications Ordered in ED Medications - No data to display   Initial Impression / Assessment and Plan / ED Course  I have reviewed the triage vital signs and the nursing notes as well as the past medical history  Pertinent labs & imaging results that were available during my care of the patient were reviewed by me and considered in my medical decision making (see chart for details).  Presents with 10-day history of nasal congestion, nasal pressure and frontal sinus headache.  Productive cough x4 days with yellow phlegm.  A-Febrile, non-ill, nonseptic appearing.  Will obtain chest x-ray to r/o pneumonia.  Chest x-ray negative. Given history and physical exam patient presents most likely with frontal sinusitis.  Headache consistent with symptoms.  Nonfocal neuro exam.  No head trauma.  Will DC home with antibiotics and symptomatic management.  Discussed with patient and family members return precautions.  Patient voiced understanding    Final Clinical Impressions(s) /  ED Diagnoses   Final diagnoses:  Acute non-recurrent frontal sinusitis    ED Discharge Orders         Ordered    azithromycin (ZITHROMAX) 250 MG tablet  Daily     12/18/17 1521    guaiFENesin (ROBITUSSIN) 100 MG/5ML liquid  Every 4 hours PRN     12/18/17 1522           Tinsleigh Slovacek A, PA-C 12/18/17 1524    Tegeler, Gwenyth Allegra, MD 12/18/17 1620

## 2017-12-18 NOTE — Discharge Instructions (Addendum)
You were evaluated today for sinusitis.  I have prescribed you an antibiotic.  Please take as prescribed.  Please follow-up with your primary care provider in 1 week if symptoms have not resolved.  Return to the ED with any new or worsening symptoms such as:  Contact a doctor if: You have a fever. Your symptoms get worse. Your symptoms do not get better within 10 days. Get help right away if: You have a very bad headache. You cannot stop throwing up (vomiting). You have pain or swelling around your face or eyes. You have trouble seeing. You feel confused. Your neck is stiff. You have trouble breathing.

## 2017-12-18 NOTE — ED Triage Notes (Signed)
Pt in c/o facial pain, nasal congestion, and cough for the last few days, denies fever, no distress noted

## 2017-12-18 NOTE — ED Notes (Signed)
Patient verbalized understanding of discharge instructions and denies any further needs or questions at this time. VS stable. Patient ambulatory with steady gait.  

## 2018-03-17 ENCOUNTER — Ambulatory Visit: Payer: BC Managed Care – PPO | Admitting: Obstetrics and Gynecology

## 2018-03-17 ENCOUNTER — Encounter: Payer: Self-pay | Admitting: Obstetrics and Gynecology

## 2018-03-17 ENCOUNTER — Other Ambulatory Visit: Payer: Self-pay

## 2018-03-17 VITALS — BP 140/74 | HR 76 | Ht 65.35 in | Wt 160.0 lb

## 2018-03-17 DIAGNOSIS — D259 Leiomyoma of uterus, unspecified: Secondary | ICD-10-CM | POA: Diagnosis not present

## 2018-03-17 DIAGNOSIS — Z862 Personal history of diseases of the blood and blood-forming organs and certain disorders involving the immune mechanism: Secondary | ICD-10-CM

## 2018-03-17 DIAGNOSIS — N920 Excessive and frequent menstruation with regular cycle: Secondary | ICD-10-CM

## 2018-03-17 DIAGNOSIS — Z3009 Encounter for other general counseling and advice on contraception: Secondary | ICD-10-CM | POA: Diagnosis not present

## 2018-03-17 NOTE — Patient Instructions (Signed)
Uterine Fibroids Uterine fibroids are tissue masses (tumors) that can develop in the womb (uterus). They are also called leiomyomas. This type of tumor is not cancerous (benign) and does not spread to other parts of the body outside of the pelvic area, which is between the hip bones. Occasionally, fibroids may develop in the fallopian tubes, in the cervix, or on the support structures (ligaments) that surround the uterus. You can have one or many fibroids. Fibroids can vary in size, weight, and where they grow in the uterus. Some can become quite large. Most fibroids do not require medical treatment. What are the causes? A fibroid can develop when a single uterine cell keeps growing (replicating). Most cells in the human body have a control mechanism that keeps them from replicating without control. What are the signs or symptoms? Symptoms may include:  Heavy bleeding during your period.  Bleeding or spotting between periods.  Pelvic pain and pressure.  Bladder problems, such as needing to urinate more often (urinary frequency) or urgently.  Inability to reproduce offspring (infertility).  Miscarriages.  How is this diagnosed? Uterine fibroids are diagnosed through a physical exam. Your health care provider may feel the lumpy tumors during a pelvic exam. Ultrasonography and an MRI may be done to determine the size, location, and number of fibroids. How is this treated? Treatment may include:  Watchful waiting. This involves getting the fibroid checked by your health care provider to see if it grows or shrinks. Follow your health care provider's recommendations for how often to have this checked.  Hormone medicines. These can be taken by mouth or given through an intrauterine device (IUD).  Surgery. ? Removing the fibroids (myomectomy) or the uterus (hysterectomy). ? Removing blood supply to the fibroids (uterine artery embolization).  If fibroids interfere with your fertility and you  want to become pregnant, your health care provider may recommend having the fibroids removed. Follow these instructions at home:  Keep all follow-up visits as directed by your health care provider. This is important.  Take over-the-counter and prescription medicines only as told by your health care provider. ? If you were prescribed a hormone treatment, take the hormone medicines exactly as directed.  Ask your health care provider about taking iron pills and increasing the amount of dark green, leafy vegetables in your diet. These actions can help to boost your blood iron levels, which may be affected by heavy menstrual bleeding.  Pay close attention to your period and tell your health care provider about any changes, such as: ? Increased blood flow that requires you to use more pads or tampons than usual per month. ? A change in the number of days that your period lasts per month. ? A change in symptoms that are associated with your period, such as abdominal cramping or back pain. Contact a health care provider if:  You have pelvic pain, back pain, or abdominal cramps that cannot be controlled with medicines.  You have an increase in bleeding between and during periods.  You soak tampons or pads in a half hour or less.  You feel lightheaded, extra tired, or weak. Get help right away if:  You faint.  You have a sudden increase in pelvic pain. This information is not intended to replace advice given to you by your health care provider. Make sure you discuss any questions you have with your health care provider. Document Released: 03/20/2000 Document Revised: 11/21/2015 Document Reviewed: 09/19/2013 Elsevier Interactive Patient Education  2018 [employer]  

## 2018-03-17 NOTE — Progress Notes (Signed)
27 y.o. G0P0000 Single Black or African American Not Hispanic or Latino female here for consult regarding enlarged uterus and fibroids found on ultrasound with Dr.Cousins. The patient showed me a copy of her ultrasound results: Uterus 13 x 9 x 10 cm Multiple fibroids: LUS 8.6 x 7.6 x 7 cm  Posterior 5.7 x 5.3 x 5.5 cm Fundal 2.7 x 2.1 Anterior 3.4 x 3 x 3.5 Right lateral 6.1 x 4.2 cm   Period Cycle (Days): 28 Period Duration (Days): 5 days Period Pattern: Regular Menstrual Flow: Moderate Menstrual Control: Thin pad, Tampon Menstrual Control Change Freq (Hours): changes tampon/pad every 2-3 hours Dysmenorrhea: None, reports some cramping recently. Usually never has cramping. On her heaviest day she saturates an overnight pad in 2 hours. It has always been this heavy.   Patient's last menstrual period was 03/02/2018 (exact date).          Sexually active: Yes.   With female partner The current method of family planning is none.    Exercising: No.  The patient does not participate in regular exercise at present. Smoker:  No  Health Maintenance: Pap:  April 2019 WNL History of abnormal Pap:  no TDaP:  UTD Gardasil: Completed all 3 she thinks   reports that she has never smoked. She has never used smokeless tobacco. She reports previous alcohol use. She reports that she does not use drugs. She is a Animal nutritionist.   Past Medical History:  Diagnosis Date  . Anemia   . Anxiety   . Dysmenorrhea   . Fibroid   . Hypertension   . Migraine with aura     History reviewed. No pertinent surgical history.  No current outpatient medications on file.   No current facility-administered medications for this visit.     History reviewed. No pertinent family history.  Review of Systems  Constitutional: Negative.   HENT: Negative.   Eyes: Negative.   Respiratory: Negative.   Cardiovascular: Negative.   Gastrointestinal: Negative.   Endocrine: Negative.   Genitourinary:        Uterine fibroids  Musculoskeletal: Negative.   Skin: Negative.   Allergic/Immunologic: Negative.   Neurological: Negative.   Hematological: Negative.   Psychiatric/Behavioral: Negative.     Exam:   BP 140/74 (BP Location: Right Arm, Patient Position: Sitting, Cuff Size: Normal)   Pulse 76   Ht 5' 5.35" (1.66 m)   Wt 160 lb (72.6 kg)   LMP 03/02/2018 (Exact Date)   BMI 26.34 kg/m   Weight change: @WEIGHTCHANGE @ Height:   Height: 5' 5.35" (166 cm)  Ht Readings from Last 3 Encounters:  03/17/18 5' 5.35" (1.66 m)  04/11/15 5\' 5"  (1.651 m)  08/23/14 5\' 5"  (1.651 m)    General appearance: alert, cooperative and appears stated age Head: Normocephalic, without obvious abnormality, atraumatic Abdomen: soft, non-tender; non distended,  no masses Extremities: extremities normal, atraumatic, no cyanosis or edema Skin: Skin color, texture, turgor normal. No rashes or lesions    Pelvic: External genitalia:  no lesions              Urethra:  normal appearing urethra with no masses, tenderness or lesions              Bartholins and Skenes: normal                Cervix: no cervical motion tenderness               Bimanual Exam:  Uterus:  uterus anteverted,  mobile, 14-16 week sized, not tender              Adnexa: no mass, fullness, tenderness                Chaperone was present for exam.  A:  Fibroid uterus  Menorrhagia  H/O anemia, patient was told to start iron, not sure how anemic she was  Desire for pregnancy, has a female partner  H/O migraines with aura  H/O HTN, not on medication first BP elevated, repeat normal  P:   Will get a copy of GYN notes, ultrasound, and labs  Discussed fibroids, possible locations, potential symptoms, possible risks with pregnancy  We discussed medical options for treatment of her menorrhagia. She is not a candidate for OCP's, not interested in depo-provera. Could try the mini-pill or possibly the mirena IUD (effectiveness would vary depending on  location of her fibroids and size of her cavity, contraception is not an issue)  I encouraged her not to put off getting pregnant if that's what she wants to do  Recommended that she have a consultation with Dr Kerin Perna to discuss pregnancy   Will call her after I've reviewed her records  Repeat BP 128/84   Over 30 minutes was spent face to face with the patient, over 50% in counseling

## 2018-03-31 ENCOUNTER — Encounter: Payer: Self-pay | Admitting: Obstetrics and Gynecology

## 2018-03-31 ENCOUNTER — Telehealth: Payer: Self-pay | Admitting: Obstetrics and Gynecology

## 2018-03-31 DIAGNOSIS — D259 Leiomyoma of uterus, unspecified: Secondary | ICD-10-CM | POA: Insufficient documentation

## 2018-03-31 DIAGNOSIS — Q632 Ectopic kidney: Secondary | ICD-10-CM | POA: Insufficient documentation

## 2018-03-31 NOTE — Telephone Encounter (Signed)
Please let the patient know that I got a copy of her GYN records, no CBC in the records. Nothing else that changes what we have already discussed. She should f/u with Dr Kerin Perna to discuss fertility, referral already placed.  See if you can get a copy of her last CBC, or have her come in for a CBC/ferritin.

## 2018-03-31 NOTE — Telephone Encounter (Signed)
Spoke with patient. Advised of message as seen below from [place]. Patient verbalizes understanding. Patient states that she thinks she may have had labs done at [facility]. She will call to request records be sent to our office for Dr.Jertson's review. Will return call if she is unable to obtain to schedule a lab appointment.  Routing to provider and will close encounter.

## 2019-01-24 IMAGING — DX DG CHEST 2V
2 series · 2 of 2 positions shown · non-contrast
Comparison: None.

CLINICAL DATA: Cough and congestion for 2 days

EXAM:
CHEST - 2 VIEW

[w chest pa]
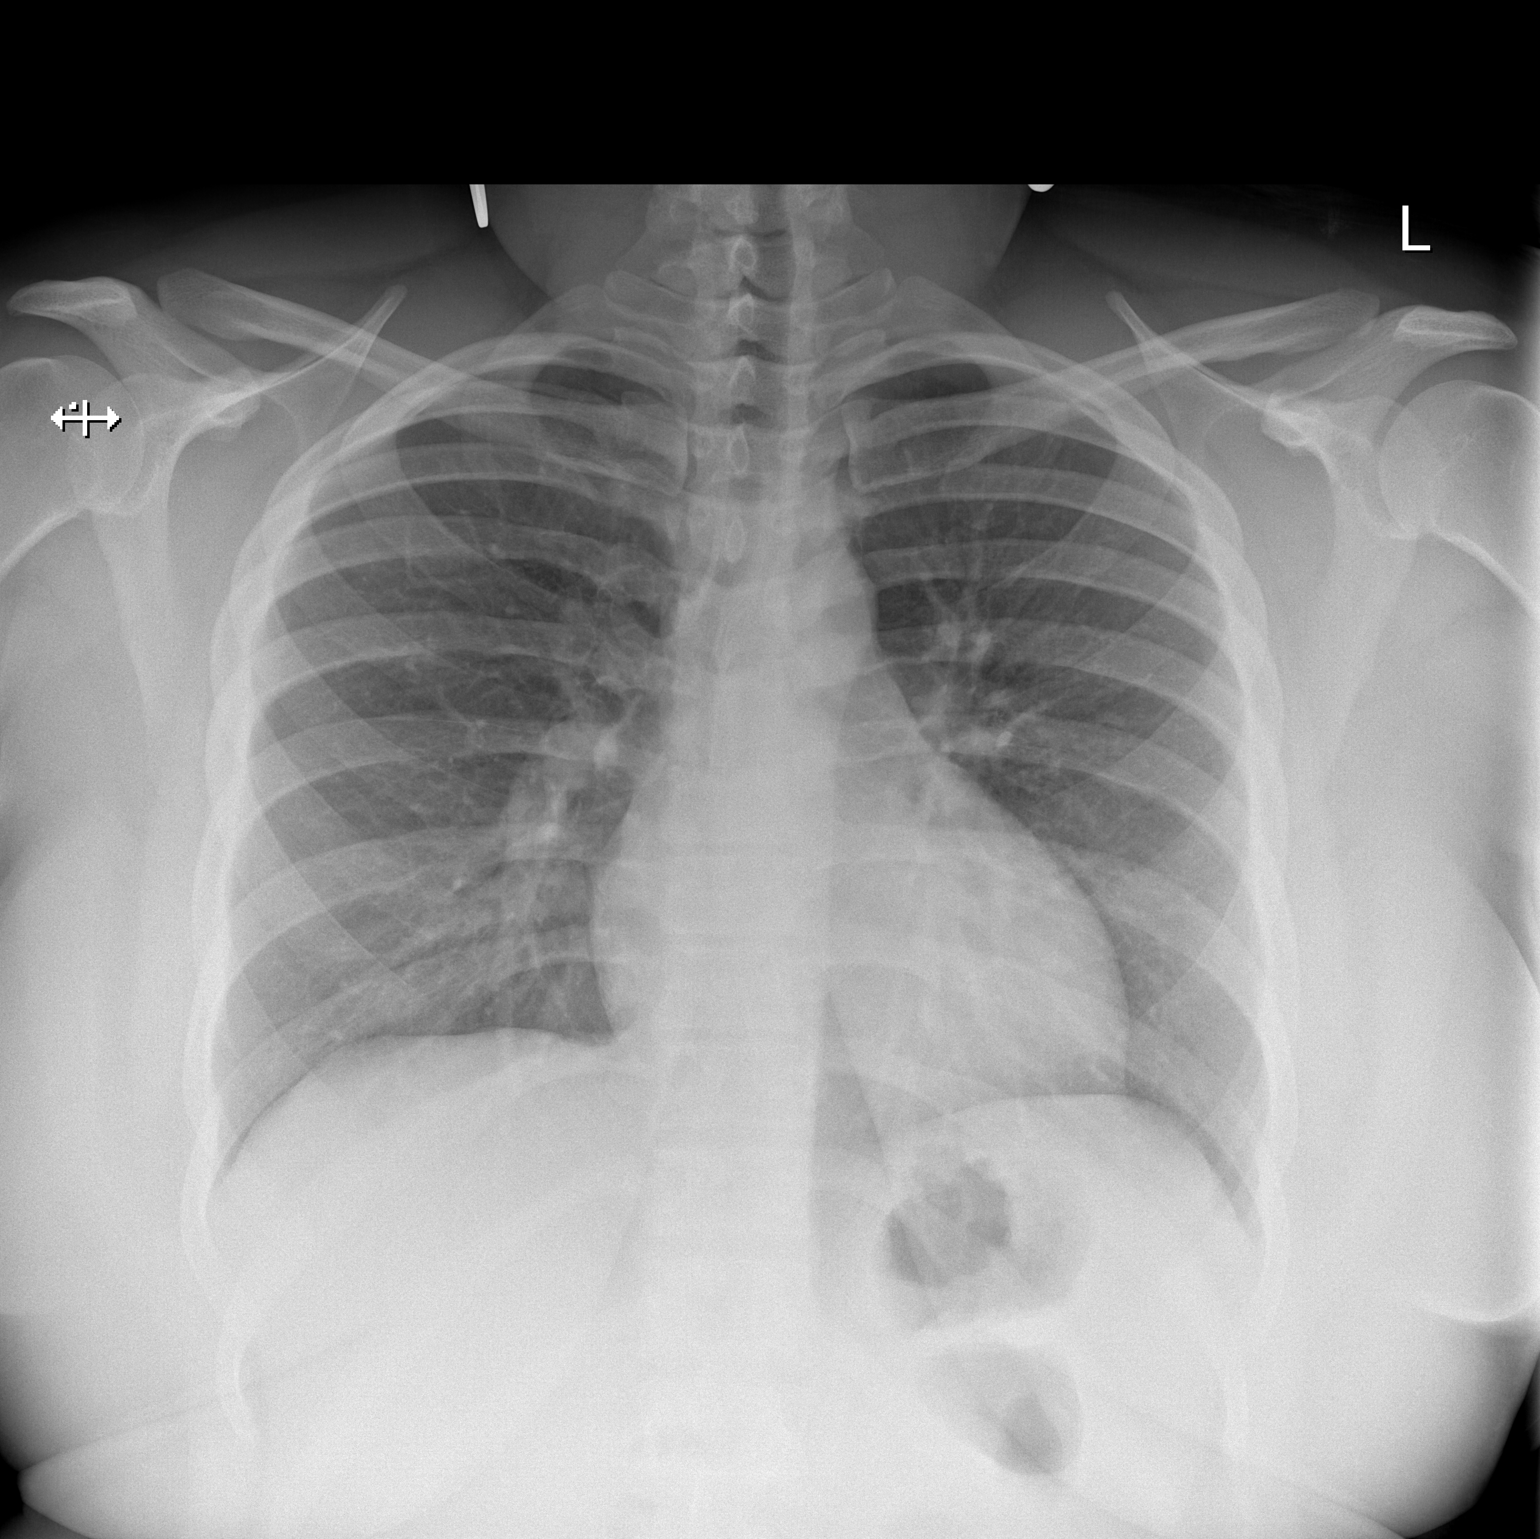

[w chest lat]
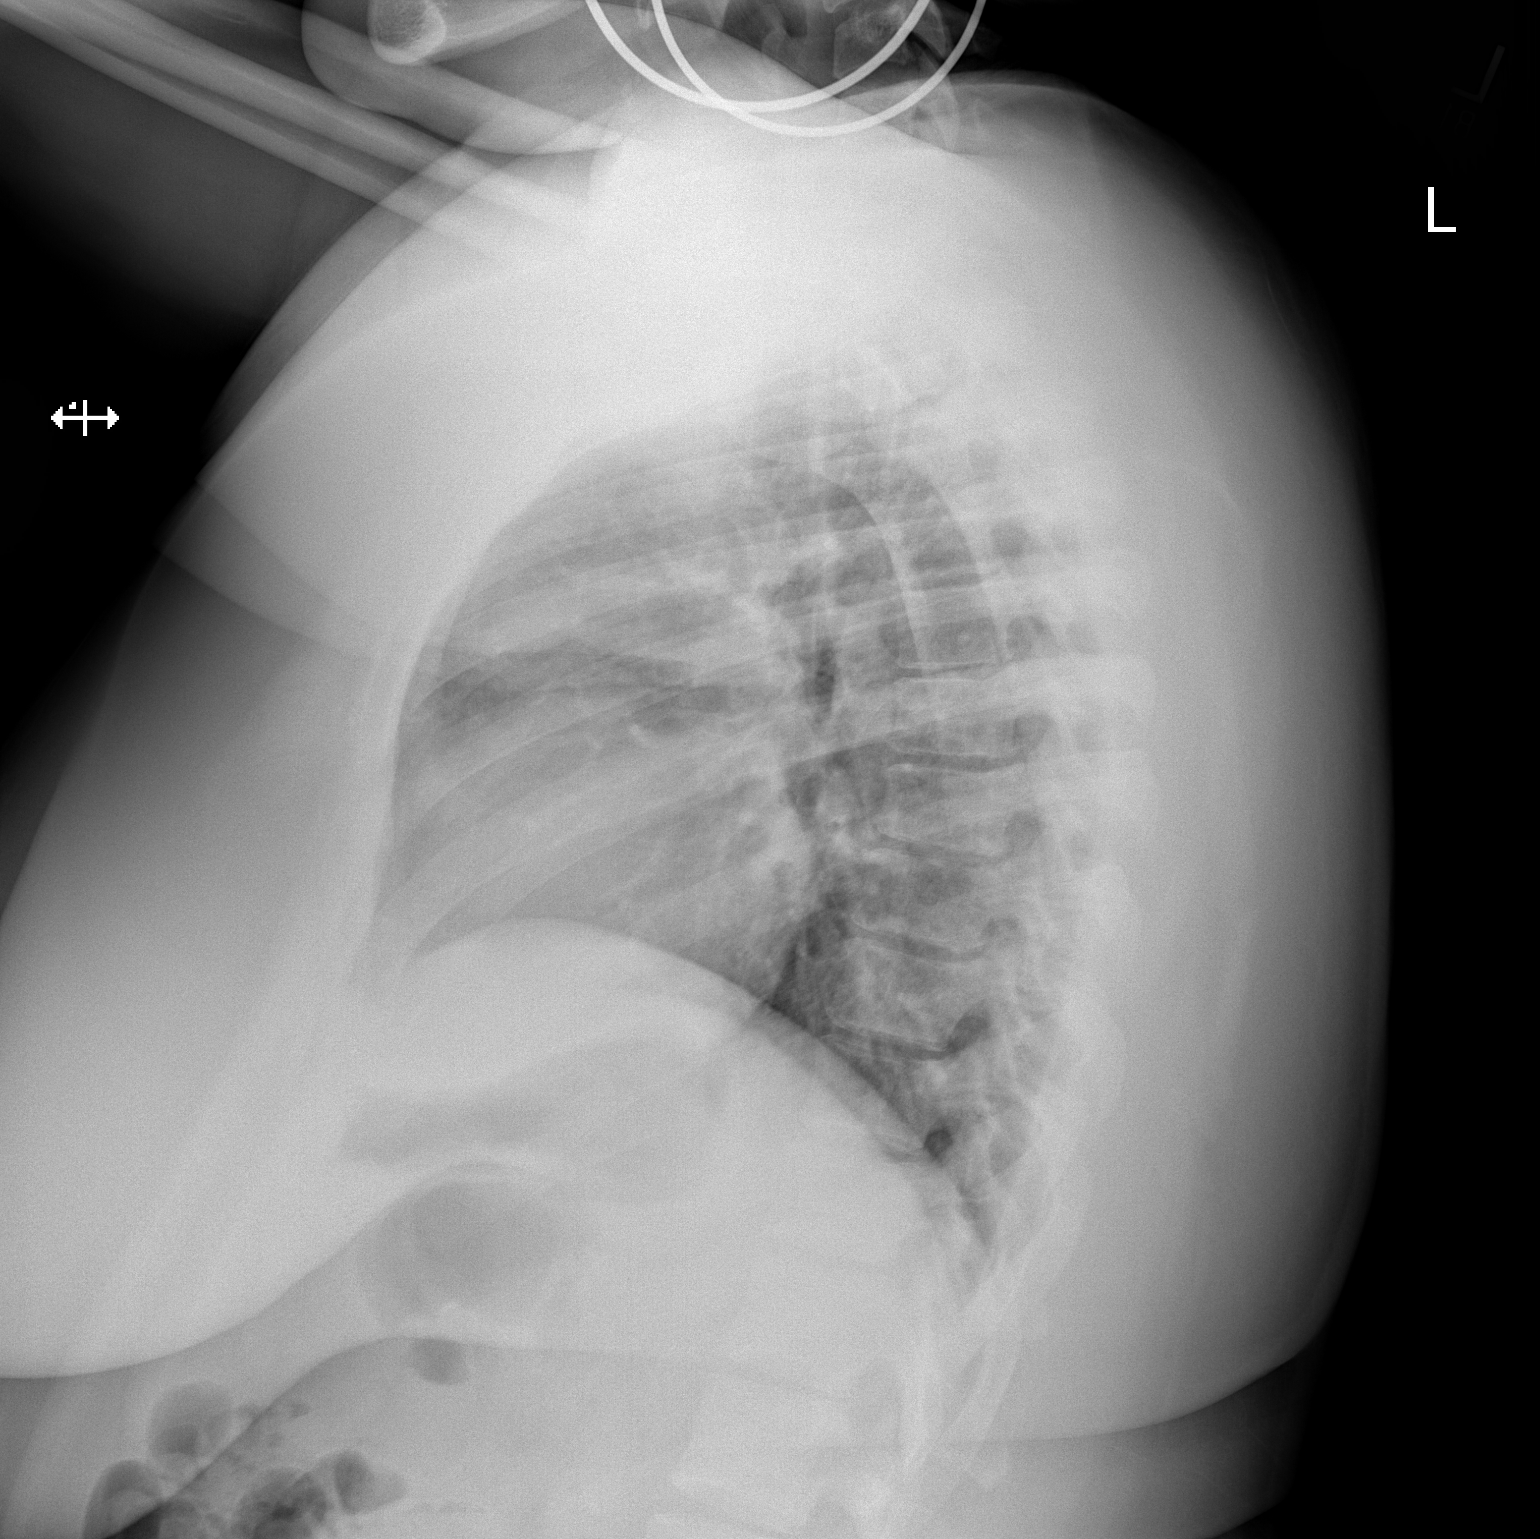

[2 of 2 positions shown; findings below may reference images not displayed]

FINDINGS: The heart size and mediastinal contours are within normal limits.
Both lungs are clear. The visualized skeletal structures are
unremarkable.
IMPRESSION: No active cardiopulmonary disease.
